# Patient Record
Sex: Male | Born: 1945 | Race: White | Hispanic: No | Marital: Married
Health system: Southern US, Community
[De-identification: ages and names within clinical notes are randomized; demographics above are authoritative.]

---

## 2004-01-20 ENCOUNTER — Inpatient Hospital Stay: Payer: Self-pay | Admitting: Internal Medicine

## 2004-01-20 ENCOUNTER — Ambulatory Visit: Payer: Self-pay | Admitting: Family Medicine

## 2004-01-20 ENCOUNTER — Other Ambulatory Visit: Payer: Self-pay

## 2004-01-27 ENCOUNTER — Ambulatory Visit: Payer: Self-pay | Admitting: Internal Medicine

## 2004-01-27 ENCOUNTER — Inpatient Hospital Stay: Payer: Self-pay

## 2005-01-05 ENCOUNTER — Emergency Department: Payer: Self-pay | Admitting: Emergency Medicine

## 2005-01-06 ENCOUNTER — Inpatient Hospital Stay: Payer: Self-pay

## 2005-05-26 ENCOUNTER — Ambulatory Visit: Payer: Self-pay | Admitting: Gastroenterology

## 2005-06-02 ENCOUNTER — Other Ambulatory Visit: Payer: Self-pay

## 2005-06-10 ENCOUNTER — Ambulatory Visit: Payer: Self-pay | Admitting: General Surgery

## 2005-08-07 ENCOUNTER — Emergency Department: Payer: Self-pay | Admitting: Emergency Medicine

## 2006-01-06 ENCOUNTER — Ambulatory Visit: Payer: Self-pay | Admitting: Gastroenterology

## 2006-01-17 ENCOUNTER — Ambulatory Visit: Payer: Self-pay | Admitting: Gastroenterology

## 2008-03-06 IMAGING — US ABDOMEN ULTRASOUND
1 series · 16 of 25 positions shown · non-contrast
Comparison: none

REASON FOR EXAM: Six month follow-up of Hepatitis B
COMMENTS:

[Series 1: abdomen ultrasound · 16 of 72 slices shown]
[im 1/72]
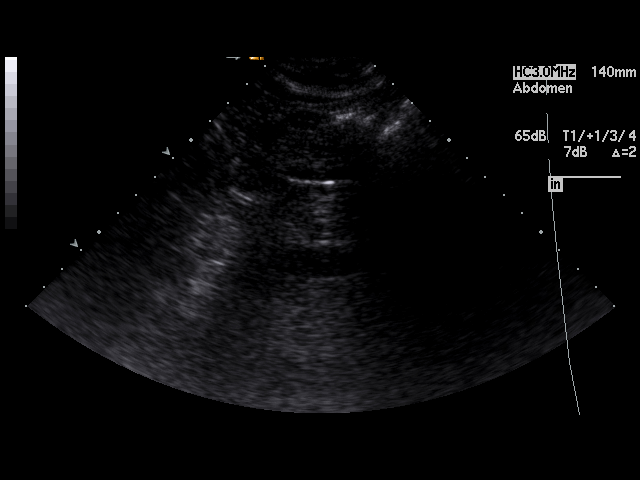
[im 6/72]
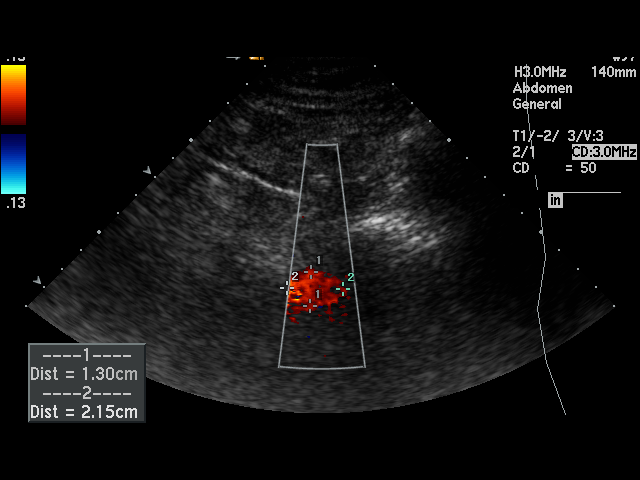
[im 9/72]
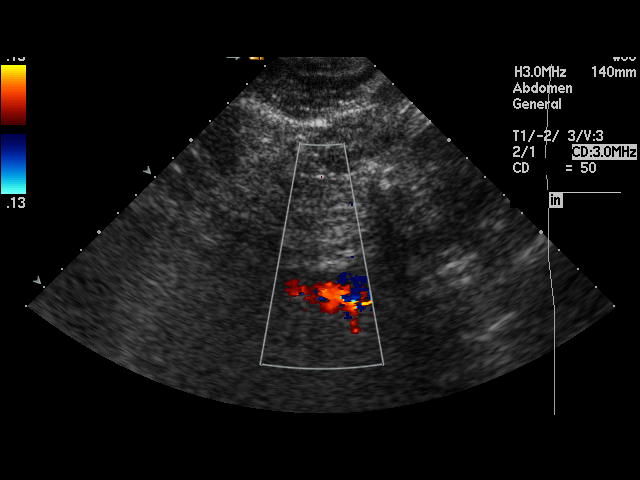
[im 15/72]
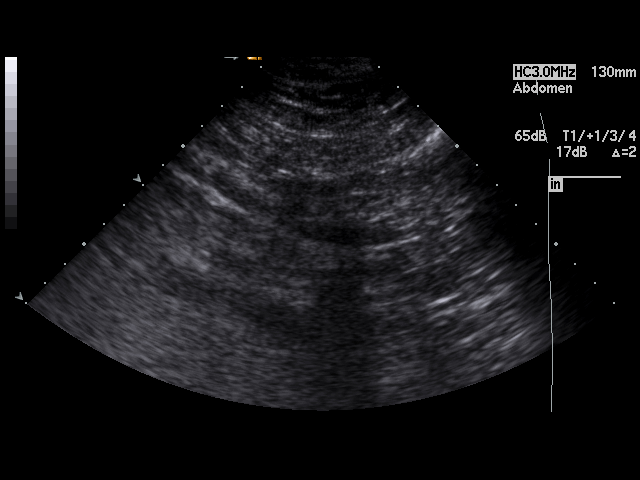
[im 21/72]
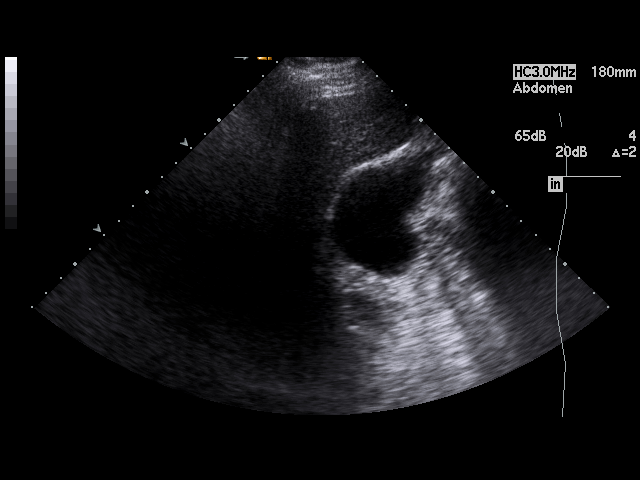
[im 24/72]
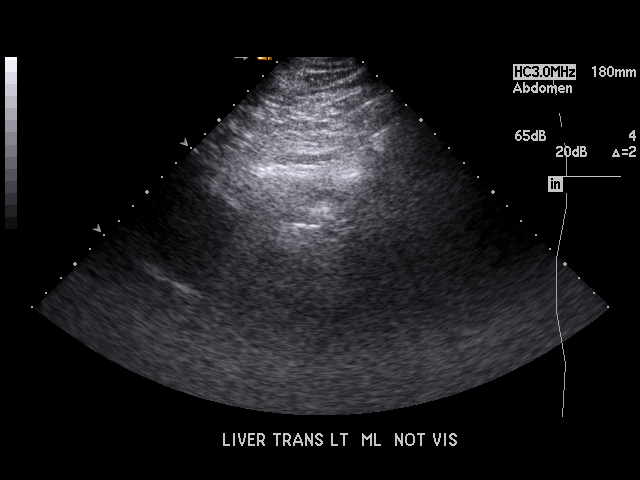
[im 30/72]
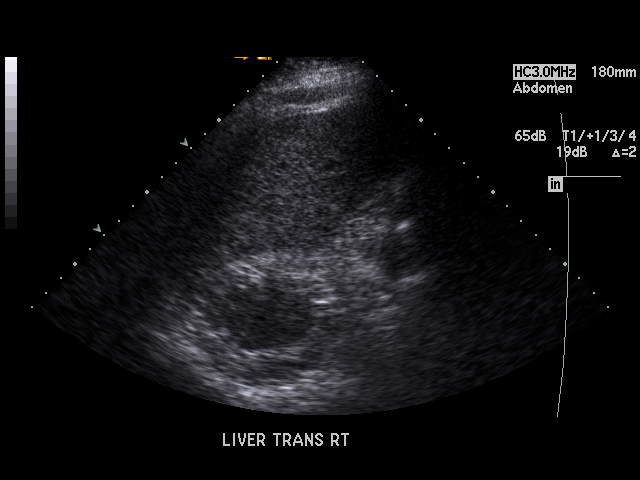
[im 33/72]
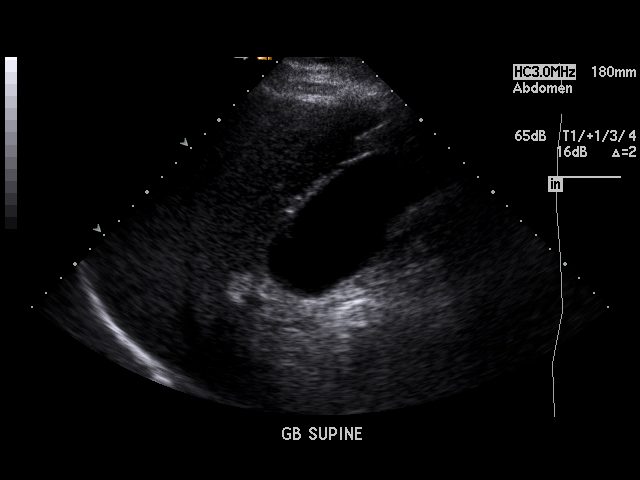
[im 39/72]
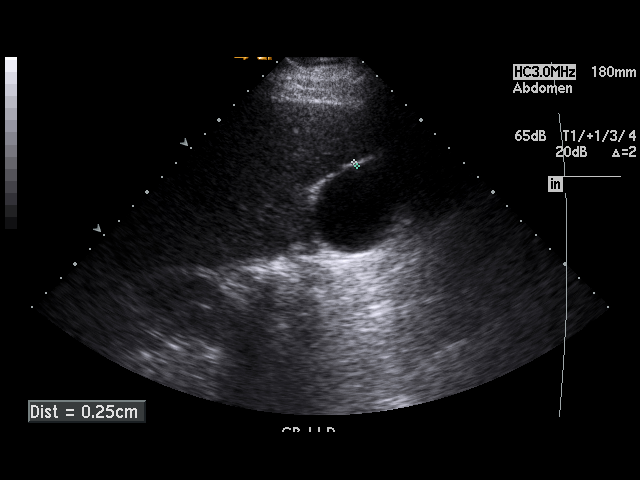
[im 42/72]
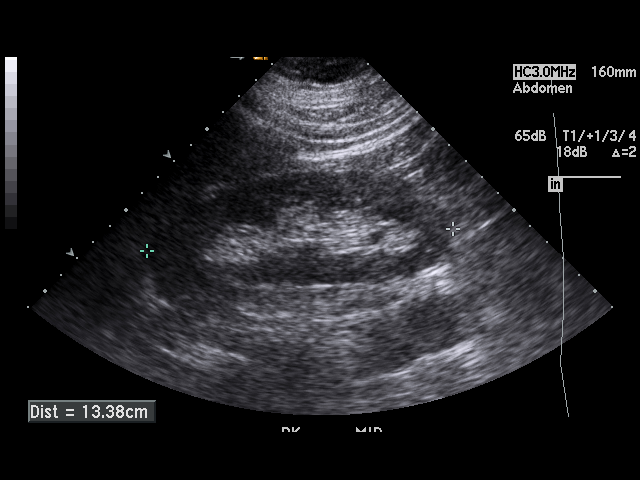
[im 48/72]
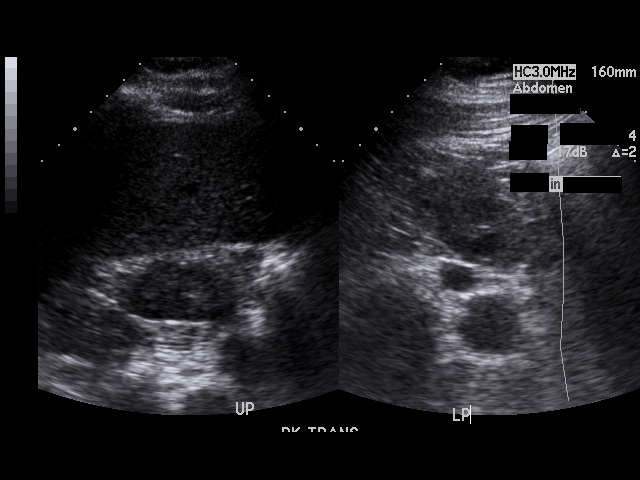
[im 51/72]
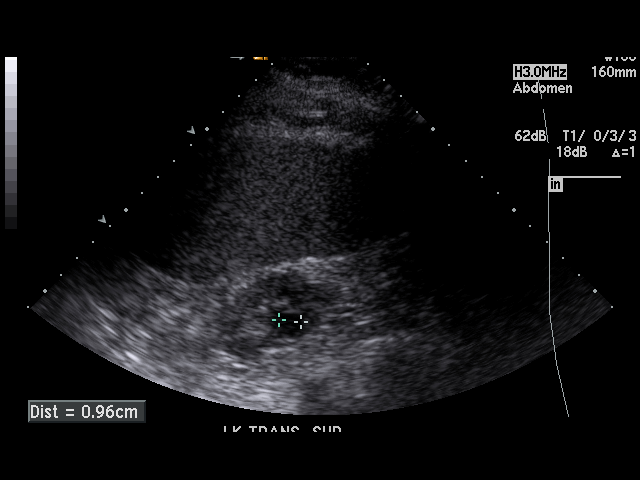
[im 57/72]
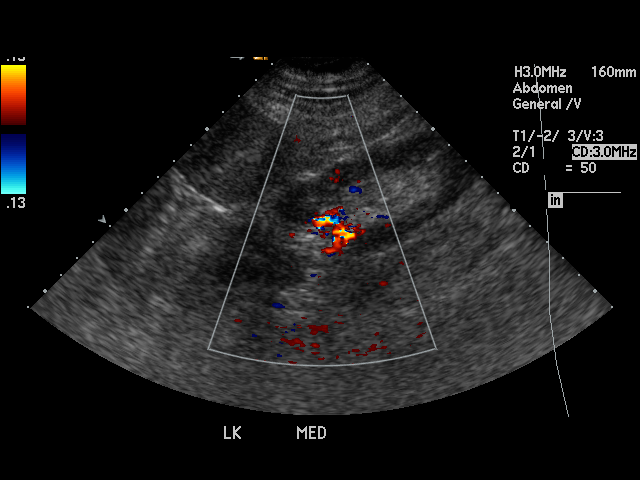
[im 63/72]
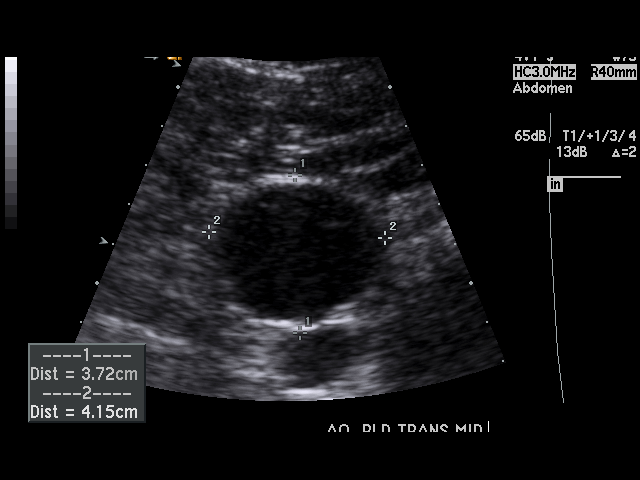
[im 66/72]
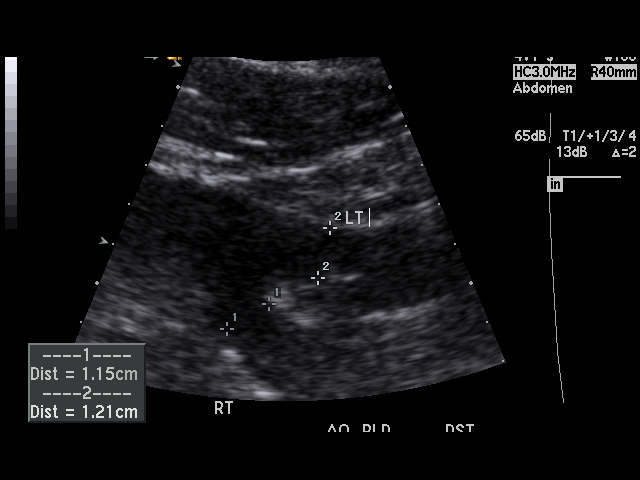
[im 72/72]
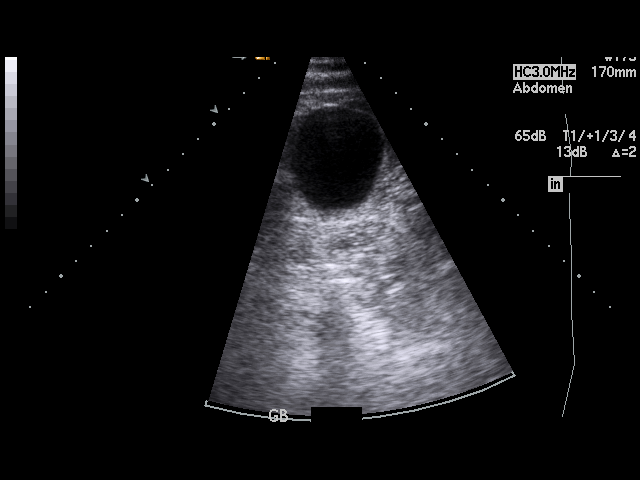

[16 of 25 positions shown; findings below may reference images not displayed]

PROCEDURE:     US  - US ABDOMEN GENERAL SURVEY  - January 06, 2006  [DATE]

RESULT:     The patient has a history of Hepatitis B.

Reference is made to a report of a prior study of 05/26/2005.

The observed portions of the liver are normal in appearance. The LEFT lobe
could not be assessed adequately. The pancreas could not be demonstrated
either due to bowel gas as well. The gallbladder is adequately distended
with no evidence of stones, wall thickening or pericholecystic fluid. The
common bile duct is normal at 3.4 mm in diameter. The spleen remains
enlarged at approximately 17.1 cm. The abdominal aorta exhibits a mid and
distal aneurysm with maximal dimension of 4.2 cm which occurs in the
transverse plane. This does appear to have increased in size since the prior
study. Further evaluation with CT scanning may be of value to better assess
the anatomy of the aneurysm.

The kidneys are normal in size. The RIGHT kidney measures 13.4 cm and the
LEFT kidney measures 12.4 cm. The LEFT kidney demonstrates the presence of
cystic structures. One lies in the upper pole measuring approximately 1.4 cm
in greatest dimension while the second is in the lower pole and also
measures approximately 1.4 cm in greatest dimension. There is no
hydronephrosis.
IMPRESSION: 1.     There is stable splenomegaly at approximately 17.1 cm. I see no acute
abnormality of the visualized portions of the liver. There is no evidence of
abnormality of the gallbladder.
2.     There is an abdominal aortic aneurysm which has increased in size to
approximately 4.2 cm in maximal dimension. This is from 3.9 cm on the prior
study. Correlation with CT scanning may be of value.
3.     There are findings compatible with simple cysts in the LEFT kidney.

## 2013-10-07 LAB — URINALYSIS, COMPLETE
BACTERIA: NEGATIVE
SPECIFIC GRAVITY: 1.026 (ref 1.003–1.030)
SQUAMOUS EPITHELIAL: NONE SEEN
WBC UR: NONE SEEN /HPF (ref 0–5)

## 2013-10-07 LAB — COMPREHENSIVE METABOLIC PANEL
ALT: 22 U/L
Albumin: 2.7 g/dL — ABNORMAL LOW (ref 3.4–5.0)
Alkaline Phosphatase: 79 U/L
Anion Gap: 10 (ref 7–16)
BUN: 17 mg/dL (ref 7–18)
Bilirubin,Total: 0.8 mg/dL (ref 0.2–1.0)
CO2: 21 mmol/L (ref 21–32)
Calcium, Total: 8 mg/dL — ABNORMAL LOW (ref 8.5–10.1)
Chloride: 113 mmol/L — ABNORMAL HIGH (ref 98–107)
Creatinine: 1.53 mg/dL — ABNORMAL HIGH (ref 0.60–1.30)
EGFR (African American): 53 — ABNORMAL LOW
GFR CALC NON AF AMER: 46 — AB
GLUCOSE: 107 mg/dL — AB (ref 65–99)
OSMOLALITY: 289 (ref 275–301)
Potassium: 3.6 mmol/L (ref 3.5–5.1)
SGOT(AST): 28 U/L (ref 15–37)
Sodium: 144 mmol/L (ref 136–145)
Total Protein: 6.7 g/dL (ref 6.4–8.2)

## 2013-10-07 LAB — CBC
HCT: 31.5 % — AB (ref 40.0–52.0)
HGB: 10 g/dL — ABNORMAL LOW (ref 13.0–18.0)
MCH: 28 pg (ref 26.0–34.0)
MCHC: 31.7 g/dL — ABNORMAL LOW (ref 32.0–36.0)
MCV: 88 fL (ref 80–100)
Platelet: 49 10*3/uL — ABNORMAL LOW (ref 150–440)
RBC: 3.57 10*6/uL — ABNORMAL LOW (ref 4.40–5.90)
RDW: 19.2 % — ABNORMAL HIGH (ref 11.5–14.5)
WBC: 3.6 10*3/uL — ABNORMAL LOW (ref 3.8–10.6)

## 2013-10-07 LAB — LIPASE, BLOOD: LIPASE: 160 U/L (ref 73–393)

## 2013-10-08 ENCOUNTER — Observation Stay: Payer: Self-pay | Admitting: Internal Medicine

## 2013-10-09 LAB — CBC WITH DIFFERENTIAL/PLATELET
BASOS ABS: 0 10*3/uL (ref 0.0–0.1)
Basophil %: 0.9 %
EOS ABS: 0.1 10*3/uL (ref 0.0–0.7)
Eosinophil %: 1.9 %
HCT: 27 % — AB (ref 40.0–52.0)
HGB: 8.6 g/dL — ABNORMAL LOW (ref 13.0–18.0)
Lymphocyte #: 0.4 10*3/uL — ABNORMAL LOW (ref 1.0–3.6)
Lymphocyte %: 12.6 %
MCH: 28.2 pg (ref 26.0–34.0)
MCHC: 31.7 g/dL — AB (ref 32.0–36.0)
MCV: 89 fL (ref 80–100)
Monocyte #: 0.3 x10 3/mm (ref 0.2–1.0)
Monocyte %: 11.2 %
Neutrophil #: 2.2 10*3/uL (ref 1.4–6.5)
Neutrophil %: 73.4 %
Platelet: 44 10*3/uL — ABNORMAL LOW (ref 150–440)
RBC: 3.04 10*6/uL — AB (ref 4.40–5.90)
RDW: 19.1 % — AB (ref 11.5–14.5)
WBC: 2.9 10*3/uL — AB (ref 3.8–10.6)

## 2013-10-09 LAB — BASIC METABOLIC PANEL
Anion Gap: 7 (ref 7–16)
BUN: 14 mg/dL (ref 7–18)
CHLORIDE: 113 mmol/L — AB (ref 98–107)
CREATININE: 1.2 mg/dL (ref 0.60–1.30)
Calcium, Total: 7.7 mg/dL — ABNORMAL LOW (ref 8.5–10.1)
Co2: 24 mmol/L (ref 21–32)
EGFR (Non-African Amer.): >60
GLUCOSE: 82 mg/dL (ref 65–99)
Osmolality: 286 (ref 275–301)
POTASSIUM: 3.7 mmol/L (ref 3.5–5.1)
SODIUM: 144 mmol/L (ref 136–145)

## 2013-10-10 LAB — BASIC METABOLIC PANEL
Anion Gap: 6 — ABNORMAL LOW (ref 7–16)
BUN: 13 mg/dL (ref 7–18)
Calcium, Total: 8.1 mg/dL — ABNORMAL LOW (ref 8.5–10.1)
Chloride: 112 mmol/L — ABNORMAL HIGH (ref 98–107)
Co2: 27 mmol/L (ref 21–32)
Creatinine: 1.28 mg/dL (ref 0.60–1.30)
EGFR (African American): >60
GFR CALC NON AF AMER: 57 — AB
Glucose: 86 mg/dL (ref 65–99)
Osmolality: 288 (ref 275–301)
Potassium: 4.3 mmol/L (ref 3.5–5.1)
SODIUM: 145 mmol/L (ref 136–145)

## 2013-10-10 LAB — CBC WITH DIFFERENTIAL/PLATELET
Basophil #: 0 10*3/uL (ref 0.0–0.1)
Basophil %: 1 %
Eosinophil #: 0.1 10*3/uL (ref 0.0–0.7)
Eosinophil %: 3.6 %
HCT: 26.9 % — AB (ref 40.0–52.0)
HGB: 8.5 g/dL — AB (ref 13.0–18.0)
Lymphocyte #: 0.4 10*3/uL — ABNORMAL LOW (ref 1.0–3.6)
Lymphocyte %: 16.7 %
MCH: 27.8 pg (ref 26.0–34.0)
MCHC: 31.6 g/dL — AB (ref 32.0–36.0)
MCV: 88 fL (ref 80–100)
MONO ABS: 0.3 x10 3/mm (ref 0.2–1.0)
Monocyte %: 12.4 %
Neutrophil #: 1.5 10*3/uL (ref 1.4–6.5)
Neutrophil %: 66.3 %
Platelet: 46 10*3/uL — ABNORMAL LOW (ref 150–440)
RBC: 3.05 10*6/uL — ABNORMAL LOW (ref 4.40–5.90)
RDW: 19.2 % — ABNORMAL HIGH (ref 11.5–14.5)
WBC: 2.3 10*3/uL — AB (ref 3.8–10.6)

## 2014-04-04 ENCOUNTER — Emergency Department: Payer: Self-pay | Admitting: Emergency Medicine

## 2014-04-15 ENCOUNTER — Ambulatory Visit
Admit: 2014-04-15 | Disposition: A | Discharge status: Home / Self Care | Payer: Self-pay | Attending: Internal Medicine | Admitting: Internal Medicine

## 2014-04-19 ENCOUNTER — Ambulatory Visit: Payer: Self-pay | Admitting: Urology

## 2014-04-19 ENCOUNTER — Inpatient Hospital Stay: Payer: Self-pay | Admitting: Internal Medicine

## 2014-05-16 ENCOUNTER — Ambulatory Visit
Admit: 2014-05-16 | Disposition: A | Discharge status: Home / Self Care | Payer: Self-pay | Attending: Internal Medicine | Admitting: Internal Medicine

## 2014-05-16 DEATH — deceased

## 2014-06-07 NOTE — Consult Note (Signed)
Brief Consult Note: Diagnosis: Hematuria. Bladder cancer. 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   Patient was seen by consultant.   Consult note dictated.   Recommend further assessment or treatment.   Orders entered.   Discussed with Attending MD.   Comments: Urine is clear now so CBI can be discontiued. rmove foley in AM. [facility].  Electronic Signatures: Orson ApeWolff, Genie Mirabal R (MD)  (Signed 25-Aug-15 12:14)  Authored: Brief Consult Note   Last Updated: 25-Aug-15 12:14 by Orson ApeWolff, [place] Oborn R (MD)

## 2014-06-07 NOTE — Discharge Summary (Signed)
PATIENT NAME:  Matthew Fitzgerald, Matthew Fitzgerald MR#:  [id number] DATE OF BIRTH:  Feb 01, 1946  DATE OF ADMISSION:  10/08/2013 DATE OF DISCHARGE:  10/10/2013  DISCHARGE DIAGNOSES:  1.  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 will require outpatient neurologic followup. 2.  Acute on chronic anemia, likely blood loss from hematuria, now hemodynamically stable.   SECONDARY DIAGNOSES:  1.  Hypertension.  2.  Hyperlipidemia.  3.  History of bladder cancer.  4.  Aortic aneurysm, status post placement of 6 stents.  5.  Esophageal rupture. 6.  Hepatitis B.  7.  Hernia repair.   CONSULTATION: Urology, Anola GurneyMichael Wolff, MD   PROCEDURES AND RADIOLOGY: None.  LABORATORY DATA: UA on admission was negative.   HISTORY AND SHORT HOSPITAL COURSE: The patient is a 69 year old male with the above mentioned medical problems, was admitted for hematuria. Please see Dr. Heron NayVasireddy dictated history and physical for further details. Foley catheterization was placed. Urology consultation was obtained with Dr. Anola GurneyMichael Wolff who recommended continuing continuous bladder irrigation until hematuria clears up. Within 24 hours this was cleared. His hematuria was thought to be possibly from radiation cystitis from underlying bladder cancer. As the hematuria resolved, the patient's Foley catheter was pulled out and he was able to urinate and is being discharged home in stable condition, as he remained hemodynamically stable on 27th of August. On the date of discharge his vital signs were as follows: Temperature 98.3, heart rate 68 per minute, respirations 18 per minute, blood pressure 107/66, oxygen saturation 93% on room air.   PERTINENT PHYSICAL EXAMINATION ON THE DATE OF DISCHARGE:  CARDIOVASCULAR: S1, S2 normal. No murmurs, rubs or gallop.  LUNGS: Clear to auscultation bilaterally. No wheezing, rales, rhonchi or crepitation.  ABDOMEN: Soft, obese and benign.  NEUROLOGIC: Nonfocal examination.  All other  physical examination remained at baseline.   DISCHARGE MEDICATIONS:  1.  Senna 1 tablet p.o. at bedtime as needed.  2.  Amlodipine 5 mg p.o. daily.  3.  Atorvastatin 80 mg p.o. at bedtime.  4.  Entecavir 0.5 mg p.o. daily. 5.  Finasteride 5 mg p.o. daily.  6.  Propranolol 40 mg p.o. b.i.d.  7.  Tamsulosin 0.4 mg p.o. daily.  8.  Aspirin 81 mg p.o. b.i.d., to be held until 30th of August, can be resumed on 10/14/2013, on Monday.   DISCHARGE DIET: Low sodium.   DISCHARGE ACTIVITY: As tolerated.   DISCHARGE INSTRUCTIONS AND FOLLOWUP: The patient was instructed to follow up with his primary care physician, Dr. Launa FlightAnn Chelminski in 1-2 weeks. [facility] and certainly call their office if he starts having more trouble with urination or hematuria for earlier appointment.   TOTAL TIME DISCHARGING THIS PATIENT: Forty minutes.   ____________________________ Ellamae SiaVipul S. Sherryll BurgerShah, MD vss:TT D: 10/10/2013 13:43:56 ET T: 10/10/2013 15:59:28 ET JOB#: [id number]  cc: Chaos Carlile S. Sherryll BurgerShah, MD, <Dictator> Riley LamAnn N. Chelminski, MD Suszanne ConnersMichael R. Evelene CroonWolff, MD Ellamae SiaVIPUL S [facility] MD ELECTRONICALLY SIGNED 10/11/2013 12:06

## 2014-06-07 NOTE — H&P (Signed)
PATIENT NAME:  Matthew Fitzgerald, Matthew Fitzgerald MR#:  [id number] DATE OF BIRTH:  06-20-1945  DATE OF ADMISSION:  10/08/2013  PRIMARY CARE PHYSICIAN: Launa Flight, MD.  REFERRING PHYSICIAN:  Toney Rakes, MD.  CHIEF COMPLAINT:  Hematuria.   HISTORY OF PRESENT ILLNESS: Matthew Fitzgerald is a 69 year old pleasant male with history of aortic aneurysm status post a stent placement, hypertension, hyperlipidemia, who was diagnosed with a bladder cancer in May 2015. The patient underwent radiation therapy at [facility]. The patient completed the course of the treatment about 1 month back.  Since then the patient has been experiencing hematuria. This is his 4th admission. He was admitted to [facility]. The patient was discharged in this afternoon. He continued to have hematuria. The patient came back home, started to experience severe pain in the suprapubic area, was unable to urinate. Concerning this he came to the Emergency Department.  After a Foley catheter was placed the patient had improvement of the pain in his abdomen. The patient was initiated on a 3-way catheter for the bladder. He denies having any fever. The patient does not want to go back to the [facility], requesting to be seen by a urologist here.   PAST MEDICAL HISTORY:  1.  Hypertension.  2.  Hyperlipidemia.  3.  Continued tobacco use.  4.  Bladder cancer.  5.  Aortic aneurysm, status post placement of 6 stents.  6.  Esophageal rupture. 7.  Hepatitis B.  8.  Hernia repair.   ALLERGIES: No known drug allergies.   HOME MEDICATIONS:  1.  Tamsulosin 0.4 mg once a day.  2.  Senna 8.6 mg once a day.  3.  Propranolol 40 mg 2 times a day.  4.  Finasteride 5 mg once a day.  5.  Entecavir 0.5 mg once a day.  6.  Atorvastatin 80 mg once a day.  7.  Aspirin 81 mg 2 capsules once a day.   SOCIAL HISTORY: Continues to smoke 1.5 packs per day. Denies drinking alcohol or using illicit drugs.   FAMILY HISTORY: Mother lived to be 32. Father died of  myocardial infarction.   REVIEW OF SYSTEMS:  CONSTITUTIONAL: Experiencing generalized weakness.  EYES: No change in vision.  ENT: No change in hearing.  RESPIRATORY: No cough, shortness of breath.  CARDIOVASCULAR: No chest pain, palpations.  GASTROINTESTINAL: No nausea or vomiting. Has abdominal pain.  GENITOURINARY: Has hematuria.  SKIN: No rash or lesions.  MUSCULOSKELETAL: No joint pains and aches.  NEUROLOGIC: No weakness or numbness in any part of the body.   PHYSICAL EXAMINATION:  GENERAL: A thin-built male lying down in the bed, not in distress.  VITAL SIGNS: Temperature 98.2, pulse 109, blood pressure 144/109, respiratory rate of 30, oxygen saturation is 95% on room air.  HEENT: Head normocephalic, atraumatic, there is no scleral icterus. Conjunctivae normal. Pupils equal and reactive. Mucous membranes moist. No pharyngeal erythema.  NECK: Supple. No lymphadenopathy. No JVD. No carotid bruit.  CHEST: Has no focal tenderness.  LUNGS: Clear to auscultation bilaterally.  HEART: S1 and S2 regular. No murmurs are heard.  ABDOMEN: Bowel sounds present. Soft. Has tenderness in the suprapubic area, mild. No rebound or guarding. Could not appreciate any hepatosplenomegaly. Foley catheter in place.  EXTREMITIES: No pedal edema. Pulses 2+.  SKIN: No rash or lesions.  MUSCULOSKELETAL: Good range of motion in all the extremities.  NEUROLOGIC: The patient is alert, oriented to place, person, and time. Cranial nerves II-XII intact. Motor 5/5 in  upper and lower extremities.   LABORATORY DATA: BMP: BUN 17, creatinine 1.53, rest of the values are within normal limits.  CBC: WBC of 3.6, hemoglobin 10, platelet count of 49,000.   Urinalysis shows too-numerous-to-count RBCs.   ASSESSMENT AND PLAN: Mr. Matthew Fitzgerald is a 69 year old with known history of bladder cancer who comes to the Emergency Department with hematuria.  1.  Hematuria. Will continue with 3-way catheter education. Continue to monitor  CBC. If required will transfuse for hemoglobin less than 8. 2.  Pancytopenia. This could be secondary to possible siderosis as the patient has a history of hepatitis B with portal hypertension.  3.  Continued tobacco use. Counseled with the patient regarding quitting smoking. Seems to have poor insight.  4.  Bladder cancer.  [facility]. Will consult urology in the morning.  5.  Hypertension, currently well-controlled. Will hold all blood pressure medications.  6.  Keep the patient on DVT prophylaxis with SCDs.    ____________________________ Susa GriffinsPadmaja Elena Cothern, MD pv:lt D: 10/08/2013 01:20:04 ET T: 10/08/2013 06:58:23 ET JOB#: [id number]  cc: Susa GriffinsPadmaja Conway Fedora, MD, <Dictator> Susa GriffinsPADMAJA Minnah Llamas MD ELECTRONICALLY SIGNED 10/09/2013 0:21

## 2014-06-07 NOTE — Consult Note (Signed)
PATIENT NAME:  Matthew Fitzgerald, Matthew E MR#:  [id number] DATE OF BIRTH:  1945/09/11  DATE OF CONSULTATION:  10/08/2013  REFERRING PHYSICIAN:  Susa GriffinsPadmaja Vasireddy, MD CONSULTING PHYSICIAN:  Suszanne ConnersMichael R. Evelene CroonWolff, MD  REASON FOR CONSULTATION: Hematuria.   HISTORY OF PRESENT ILLNESS: Matthew Fitzgerald is a 69 year old Caucasian male who presented to the Emergency Room with gross hematuria and passage of clots. He developed suprapubic pain and bladder distention and required catheter placement in the Emergency Room. He has been on continuous bladder irrigation since last night. His urine is clear at this time. He is not experiencing any pain at this time. He has a history of bladder cancer. He was treated with a course of radiation that was completed 4 weeks ago. The patient states that he was not a candidate for radical cystectomy or chemotherapy due to underlying medical problems. He has also had [facility] as well.   ALLERGIES: No known drug allergies.  HOME MEDICATIONS: Include tamsulosin, senna, propranolol, finasteride, entecavir, atorvastatin and aspirin.   PAST SURGICAL HISTORY: Included:  1.  Transurethral bladder tumor resection approximately 2 months ago.  2.  Some type of kidney stone surgery 4 years ago.  3.  Aortic aneurysm stent placement.  PAST AND CURRENT MEDICAL CONDITIONS:  1.  Hepatitis B.  2.  Hyperlipidemia.  3.  Hypertension.  4.  Esophageal rupture.  REVIEW OF SYSTEMS: The patient denied flank pain. He does have a history of BPH with LUTS and is currently being managed with finasteride and tamsulosin. He does not recall recent PSA results.   FAMILY HISTORY: Remarkable for father with heart disease.   SOCIAL HISTORY: The patient smokes a pack half a day and has a greater than 50 pack-year history. He denied alcohol use.   PHYSICAL EXAMINATION: GENERAL: Well-nourished white male in no acute distress.  HEENT: Sclerae were clear.  NECK: Supple.   LUNGS: Clear to auscultation.  ABDOMEN: Soft. Bladder was not palpably distended. Foley catheter was in good position.  RECTAL: Deferred.   PERTINENT LABORATORY STUDIES: Included a BUN of 17 and creatinine 1.53. Hematocrit was 31.5% and white cell count was 3,600.   IMPRESSION: 1.  Gross hematuria with retention.  2.  Bladder cancer.  3.  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   SUGGESTIONS: 1.  Discontinue continuous bladder irrigation and remove the catheter in the morning, if the urine remains clear. 2.  Increase overall fluid intake.  3.  Discontinue aspirin. 4.  [facility] for further evaluation and treatment.  ____________________________ Suszanne ConnersMichael R. Evelene CroonWolff, MD mrw:sb D: 10/08/2013 12:25:45 ET T: 10/08/2013 13:14:43 ET JOB#: [id number]  cc: Suszanne ConnersMichael R. Evelene CroonWolff, MD, <Dictator> Orson ApeMICHAEL R WOLFF MD ELECTRONICALLY SIGNED 10/09/2013 9:39

## 2014-06-15 NOTE — Consult Note (Signed)
ONCOLOGY followup - still weak. Pain better controlled.no fevers. Eating better.weak-looking, resting in bed, alert and oriented, no acute distress.            vitals - afebrile, stable.          lungs - bilateral good breath sounds          abd - soft, NT          ext - pedal edema  Cr 1.55, calcium 7.8.  04/21/14 - CT chest. IMPRESSION: 1. Numerous bilateral pulmonary nodules are identified compatible with metastatic disease. 2. Subtle lucent abnormality within the posterior T4 vertebral body which may represent an early lytic bone metastasis. 3. Atherosclerotic disease including Coronary artery calcifications.  69 year old gentleman with h/o bladder cancer ([facility] and [facility]) now diagnosed with widespread metastatic disease. CT scan of the abdomen and pelvis is showing findings consistent with widespread metastatic disease to the lungs, liver, bones, and lymphadenopathy. CT chest shows multiple lung lesions s/o metastases. Have explained to the patient and his son about radiological findings are most consistent with metastatic stage IV bladder cancer and also explained that with metastatic disease, stage IV malignancy is incurable and treatments like chemotherapy offered are with palliative intent only. Also have explained that he is a poor candidate to tolerate chemotherapy given poor performance status (ECOG 3-4), pre-existing pancytopenia and co-morbidities including cirrhosis. Patient and son have made decision no to pursue any cancer treatment and he is pursuing home hospice. Pain is under better control. Will see him on as-needed basis.    Electronic Signatures: Izola PricePandit, Aavya Shafer Raj (MD)  (Signed on 08-Mar-16 21:00)  Authored  Last Updated: 08-Mar-16 21:00 by Izola PricePandit, Katriana Dortch Raj (MD)

## 2014-06-15 NOTE — Consult Note (Signed)
PATIENT NAME:  Matthew Fitzgerald, Matthew Fitzgerald MR#:  [id number] DATE OF BIRTH:  29-Mar-1945  DATE OF CONSULTATION:  04/19/2014  REQUESTING PHYSICIAN:  Matthew Kind, MD CONSULTING PHYSICIAN:  Tawni Millers. Dezra Mandella, II, MD   REASON FOR CONSULTATION: Hematuria in the setting of known bladder cancer.  HISTORY OF PRESENT ILLNESS: [name] is a 69 year old male with extensive bladder cancer history.  He is status post radiation treatment and surgery at [facility].  He is under the care of [facility] urology and oncology.  He has known hypertension, hepatitis B and abdominal aortic aneurysm.  He has bilateral percutaneous nephrostomy tube.  He does void without a catheter and has chronic hematuria per the patient.  He comes to the ER and was admitted for increasing back pain.  On presentation, he denied any fever, chills or sweats or weakness, dizziness. He did have gross blood clots per urethra. He says he often passes these and has no issues passing these.  On admission, his creatinine was 7.8 and 2.5. A CT scan was ordered to check placement of percutaneous nephrostomy tubes which showed metastatic bladder cancer, metastatic to the lung, liver and bones, also noted with a pathologic fracture at L5, significant back pain, L5 vertebrae.  He has had an unremarkable course since admission. The patient denies any suprapubic pain or difficulty passing his clots.   PAST MEDICAL HISTORY:  1.  Metastatic bladder cancer status post radiation and surgery [facility].  2.  Hepatitis B.  3.  AAA.  PAST SURGICAL HISTORY:   1.  Bladder resection followed by bladder radiation.  2.  Nephrostomy tube placement.  3.  AAA stenting.   ALLERGIES: No known drug allergies.   FAMILY HISTORY: Noncontributory.   SOCIAL HISTORY: Divorced, lives with his son.   OUTPATIENT MEDICATIONS: Up-to-date and reviewed in detail in the chart. The patient is on finasteride and tamsulosin per urology.   REVIEW OF SYSTEMS:  The balance of 12 point review of systems  negative other than outlined above.   PHYSICAL EXAMINATION:  VITAL SIGNS: Temperature 36.8, pulse 69, respiratory rate 18, blood pressure 102/61, sating 93% on room air.  GENERAL: Alert and oriented x 3, conversational, interactive, quite pleasant Caucasian male.  HEENT: Normocephalic, atraumatic.  NECK: Supple, without any lymphadenopathy.  LUNGS:  Clear to auscultation.  Good respiratory effort.  CARDIOVASCULAR: Regular rate and rhythm.  GASTROINTESTINAL: Soft, nontender. He does not have any suprapubic tenderness.  BACK:  Bilateral nephrostomy tube draining clear urine with some sediment and some old blood, but draining clear in to the bag, mild tenderness over the lumbar spine.  GENITOURINARY: Normal penis. The patient states he is able to easily void clots without pain.  MUSCULOSKELETAL: 5/5 strength.  SKIN: Normal.  EXTREMITIES: 2+ upper and lower extremity pulses. No edema.  NEUROLOGIC: Grossly intact.  PSYCHIATRIC: Appropriate.   LABORATORY VALUES: Creatinine 1.25, hemoglobin and hematocrit 7.8 and 25.3. CT scan metastatic bladder cancer and a pathologic L5 fracture.  Bladder wall has likely radiation treatment changes and his bladder percutaneous nephrostomy tubes are in the appropriate place.   ASSESSMENT: This is a 69 year old Caucasian male with metastatic bladder cancer and L5 pathologic fracture, who is status post resection and radiation at [facility].  He was not deemed a candidate for radical cystectomy and chemotherapy due to his ongoing medical problems per chart review.  He is currently with normal creatinine with bilateral draining percutaneous nephrostomy tubes in the correct position.  He does void clots from his urethra, but this  has been stable per patient and he does this without difficulty or pain.   PLAN:  1.  Continue management of bilateral percutaneous nephrostomy tubes. I do not recommend catheter placement at this time due to patient comfort.  If the patient is unable  to pass a clot, or has suprapubic tenderness consistent with urinary retention, it would be reasonable place a large-caliber catheter to irrigate him free from this clot and then subsequently remove the catheter. The patient said he has had this done many times and it is not painful for him.  Indwelling catheter is a limited utility given functioning bilateral percutaneous nephrostomy tubes.  2.  Recommend overall health status in terms of his anemia, infection, treat it as per hospitalist recommendations.  There will be no additional urological management that can be performed on him at this time given the extensiveness of his disease.  If the patient desires further treatment, I would recommend [facility] or oncology for further discussions regarding management of his advanced bladder cancer.   3.  It would be reasonable to consider palliative care consultation given overall health status and L5 pathologic fracture in the setting of advanced metastatic bladder cancer.      ____________________________ Tawni MillersEdward R. Weldon InchesHouser, II, MD erh:DT D: 04/19/2014 11:23:36 ET T: 04/19/2014 13:23:13 ET JOB#: [id number]  cc: Tawni MillersEdward R. Weldon InchesHouser, II, MD, <Dictator> Beaulah CorinEDWARD R Saidee Geremia MD ELECTRONICALLY SIGNED 04/19/2014 17:17

## 2014-06-15 NOTE — Consult Note (Signed)
   Comments   Family and pt asked for another meeting with me.  Pt willing to accept hospice services and to try "the steroid".  will order both. Hospice diagnosis: metastatic bladder cancerhepatitis B 50%  Electronic Signatures: Reather LaurenceMantzouris, Rayel Santizo J (NP)  (Signed 07-Mar-16 09:57)  Authored: Palliative Care Phifer, Harriett SineNancy (MD)  (Signed 07-Mar-16 16:11)  Authored: Palliative Care   Last Updated: 07-Mar-16 16:11 by Phifer, Harriett SineNancy (MD)

## 2014-06-15 NOTE — Consult Note (Signed)
PATIENT NAME:  Matthew Fitzgerald, Matthew Fitzgerald MR#:  [id number] DATE OF BIRTH:  21-Mar-1945  DATE OF CONSULTATION:  04/19/2014  REFERRING PHYSICIAN:  Dr. Betti Cruz  CONSULTING PHYSICIAN:  Mahamud Metts R. Sherrlyn Hock, MD  REASON FOR CONSULTATION: Metastatic bladder cancer.   HISTORY OF PRESENT ILLNESS: The patient is a 69 year old gentleman with past medical history significant for hepatitis B, AAA status post stenting, reportedly has bladder cancer status post surgical resection at [facility] followed by radiation therapy there along with bilateral nephrostomy tube placement. The patient states that his bladder cancer was diagnosed around mid 2015 and he initially underwent transurethral resection following which they gave him a few weeks of radiation treatment. He states that he has had recurrent hematuria in January and was hospitalized for a long time at [facility] but does not remember if he had any radiological restaging of malignancy. He is currently admitted with progressive or hematuria per urethra and also from nephrostomy tubes for a few months now along with increasing back pain. He also has developed progressive weakness. Appetite is good otherwise.  CT scan of the abdomen and pelvis done upon admission revealed multiple lesions suggestive of metastases to the liver, lung, bone and lymphadenopathy, along with pathological fracture of L5 vertebra. He states that currently Dilaudid IV is helping his pain, but is not lasting long enough. He has mild cough, dry. No hemoptysis or chest pain. Denies any dyspnea, orthopnea, or PND.   PAST MEDICAL HISTORY AND PAST SURGICAL HISTORY: As in HPI above.   FAMILY HISTORY: Remarkable for heart disease. Denies malignancy.   SOCIAL HISTORY: Denies history of smoking, alcohol or recreational drug usage. Physically active and ambulatory.   ALLERGIES: No known drug allergies.   HOME MEDICATIONS: Finasteride 5 mg p.o. daily, propranolol 40 mg b.i.d., Senna 8.6 mg daily p.r.n. for constipation,  tamsulosin 0.4 mg capsule 1 capsule q.a.m., atorvastatin 80 mg once daily, and entecavir 0.5 mg once daily.   REVIEW OF SYSTEMS:  CONSTITUTIONAL: As in HPI. No fevers or chills. No night sweats.  HEENT: Denies any headaches or dizziness. No epistaxis, ear or jaw pain.  CARDIAC: No angina, palpitation, orthopnea, or PND.  LUNGS: As in HPI.  GASTROINTESTINAL: No nausea, vomiting, or diarrhea. No bright red blood in stools or melena  GENITOURINARY: As in HPI.  SKIN: No new rashes or pruritus.  HEMATOLOGIC: Has hematuria otherwise denies bleeding symptoms.  MUSCULOSKELETAL: As in history of present illness. Having severe low back pain. Denies other new bone pains.  EXTREMITIES: No new swelling  NEUROLOGIC: No new focal weakness, seizures or loss of consciousness.  ENDOCRINE: No polyuria or polydipsia. Appetite is fairly steady.  PERFORMANCE STATUS:  ECoG 1.   PHYSICAL EXAMINATION:  GENERAL: The patient is a moderately built and nourished individual, resting in bed, alert and oriented and converses appropriately. No acute distress. No icterus.  VITAL SIGNS: 98.6, 74, 20, 119/72, 94% on room air.  HEENT: Normocephalic, atraumatic. Extraocular movements intact. No oral thrush.  NECK: Negative for lymphadenopathy.  CARDIOVASCULAR:  S1, S2, regular rate and rhythm.  LUNGS: Show bilateral good air entry, no crepitations or rhonchi noted.  ABDOMEN: Soft, nontender. No hepatosplenomegaly clinically.  EXTREMITIES: No major edema or cyanosis.  SKIN: Shows no generalized rashes or major bruising.  NEUROLOGIC: Limited exam. Cranial nerves intact. Moves all extremities spontaneously.  MUSCULOSKELETAL: No obvious joint redness or swelling.   LABORATORY RESULTS: WBC 2300, ANC 1500, hemoglobin 7.1, platelets 67. From March 4 creatinine 1.25, calcium 8, LFT showed bilirubin 0.4, alkaline  phosphatase 104.   ALT 11, AST 27 and albumin low at 2.6.   IMPRESSION AND RECOMMENDATIONS: A 69 year old gentleman  with past medical history is significant for bladder cancer (diagn[facility] and [facility] with surgery and radiation, otherwise further details regarding stage/pathology/treatment history not available for review today and will need [facility] The patient currently admitted with persistent hematuria along with severe low back pain. CT scan of the abdomen and pelvis is showing findings consistent with widespread metastatic disease to the lungs, liver, bones, and lymphadenopathy. Having been admitted with CT scan and explained to the patient that at this time findings seem to be most consistent with metastatic stage IV bladder cancer and [facility] to see how advanced his original stage bladder cancer was. I also explained that with metastatic disease, stage IV malignancy is incurable and treatments offered are with palliative intent only. I have explained that the treatment is palliative chemotherapy, patient is unsure if he would want to pursue this at this time but will think about it, I have explained, in general about chemotherapy response rates and possible side effects with regimen including gemcitabine and carboplatin or Taxol and carboplatin. Also, he still has issues with low back pain.  Will get a CT scan of the chest and bone scan on Monday to evaluate extent of bone metastasis and lung metastasis.  If he continues to have pain issues and has abnormal bone scan in this area, then could consider palliative radiation for pain control.  In the meantime, will make Dilaudid p.r.n. more frequently at q.2 hours p.r.n.  The patient also has anemia, which is likely from ongoing significant hematuria along with history of hepatitis B.  In addition, if he does have leukopenia, mild neutropenia and  thrombocytopenia, which is also likely from history of hepatitis B and finding of cirrhosis on CT scan. I have also explained that this pre-existing pancytopenia could be  an issue with him tolerating good dosages of chemotherapy but will need to watch this closely if he starts on treatment. Will continue to follow.   Thank you for the referral.  Please feel free to contact me for any additional questions.    ____________________________ Maren ReamerSandeep R. Sherrlyn HockPandit, MD srp:at D: 04/19/2014 20:27:12 ET T: 04/19/2014 21:19:29 ET JOB#: [id number]  cc: Darryll CapersSandeep R. Sherrlyn HockPandit, MD, <Dictator> Wille CelesteSANDEEP R Joslyne Marshburn MD ELECTRONICALLY SIGNED 04/20/2014 9:29

## 2014-06-15 NOTE — H&P (Signed)
PATIENT NAME:  Matthew Fitzgerald, Matthew Fitzgerald MR#:  [id number] DATE OF BIRTH:  [birth year]  DATE OF ADMISSION:  04/19/2014  REFERRING PHYSICIAN: Coolidge Breeze, MD   PRIMARY CARE PRACTITIONER: Nonlocal at [facility]   ADMITTING DOCTOR: Crissie Figures, MD   CHIEF COMPLAINTS:  1.  Passing bloody urine through the urethra and the nephrostomy tubes since June.  2.  Increasing back pain.    HISTORY OF PRESENT ILLNESS: A 69 year old Caucasian male with a history of bladder cancer status post radiation treatment and surgery, under care of [facility] urology and oncology; hypertension; hepatitis B; abdominal aortic aneurysm status post stents, presents to the Emergency Room with the complaints of ongoing bloody urine with clots per urethra and also per bilateral nephrostomy tubes, which has been going on since of June 2015. The patient also noted to have increasing back pain for the past few days, hence came to the Emergency Room for further evaluation. Denies any fevers, chills. No generalized weakness. No dizziness. No palpitations. No loss of consciousness. In the Emergency Room, the patient was evaluated by the ED physician and was noted to have gross hematuria with passing of blood and also clots per urethra and also per nephrostomy tubes, but was found to be with stable vitals; hemoglobin and hematocrit were low but stable around 7.8/25. Urinalysis showed abnormal findings suggestive of urinary tract infection. A CT scan of the abdomen and the pelvis revealed bladder cancer, which has metastasized to lung, liver, and the bones, and was also noted to have pathological fracture of L5 vertebra. In view of significant back pain and ongoing hematuria, hospitalist service was consulted for further evaluation and management. The patient had a repeat hemoglobin and hematocrit and it is stable around 7.8 at this time. No history of any chest pain, palpitations. No dizziness. No loss of consciousness. No cough. No  fever. No nausea, vomiting, or diarrhea.   PAST MEDICAL HISTORY:  1.  Bladder cancer, status post radiation treatment and surgery. The patient is under care of [facility] urologist and oncologist.  2.  Hepatitis B.  3.  AAA status post stenting.   PAST SURGICAL HISTORY:  1.  Bladder surgery for cancer in June 2015.  2.  Bilateral nephrostomy tube placement.  3.  Stenting for abdominal aortic aneurysm.   ALLERGIES: No known drug allergies.   FAMILY HISTORY: Father with heart disease.   SOCIAL HISTORY: He is divorced, lives with his son. Denies any history of smoking, alcohol, or substance abuse.    HOME MEDICATIONS:  1.  Atorvastatin 80 mg tablet 1 tablet orally once a day.  2.  Entecavir 0.5 mg oral tablet 1 tablet orally once a day in the morning.  3.  Finasteride 5 mg 1 tablet orally once a day.  4.  Propranolol 40 mg 1 tablet orally 2 times a day.  5.  Senna 8.6 mg oral tablet 1 tablet orally once a day at bedtime as needed for constipation.  6.  Tamsulosin 0.4 mg oral capsule 1 capsule orally once a day in the morning.   REVIEW OF SYSTEMS:  CONSTITUTIONAL: Negative for fever or chills. No fatigue. No generalized weakness.  EYES: Negative for blurred vision, double vision. No pain. No redness. No discharge.  EARS, NOSE, AND THROAT: Negative for tinnitus, ear pain, hearing loss, epistaxis, nasal discharge.  RESPIRATORY: Negative for cough, wheezing, dyspnea, hemoptysis, painful respiration.  CARDIOVASCULAR: Negative for chest pain, palpitations, dizziness, syncopal episodes, orthopnea, dyspnea on exertion, or pedal  edema.  GASTROINTESTINAL: Negative for nausea, vomiting, diarrhea, constipation, abdominal pain, hematemesis, melena, rectal bleeding.  GENITOURINARY: Positive for passing of blood through the urethra and also bilateral nephrostomy tubes, as noted in the history of present illness.   ENDOCRINE: Negative for polyuria, nocturia, heat or cold intolerance.  HEMATOLOGIC AND  LYMPHATIC: Positive for chronic anemia and hematuria, as noted in the history of present illness.  INTEGUMENTARY: Negative for acne, skin rash, or lesions.  MUSCULOSKELETAL: Positive for increasing back pain, as noted in the history of present illness.  NEUROLOGICAL: Negative for focal weakness or numbness. No history of CVA, TIA, or seizure disorder.  PSYCHIATRIC: Negative for anxiety, insomnia, depression.   PHYSICAL EXAMINATION:  VITAL SIGNS: Temperature 98.2 degrees Fahrenheit, pulse rate 80 per minute, respirations 18 per minute, blood pressure 108/73, pulse oximetry 97% on room air.  GENERAL: Well developed, well nourished, alert, in no acute distress, comfortably resting in the bed.  HEAD: Atraumatic, normocephalic.  EYES: Pupils equal, react to light and accommodation. No conjunctival pallor. No icterus. Extraocular movements intact.  NOSE: No drainage. No lesions.  EARS: No drainage. No external lesions. ORAL CAVITY: No mucosal lesions. No exudates.  NECK: Supple. No JVD. No thyromegaly. No carotid bruit. Range of motion of neck within normal limits.  RESPIRATORY: Good respiratory effort. Not using accessory muscles of respiration. Bilateral vesicular breath sounds present. No rales or rhonchi.  CARDIOVASCULAR: S1, S2 regular. No murmurs, gallops, or clicks. Pulses equal at carotid, femoral, and pedal pulses. No peripheral edema.  GASTROINTESTINAL: Abdomen soft, nontender. No hepatosplenomegaly. No masses. No rigidity. No guarding. Bowel sounds present and equal in all 4 quadrants.  BACK: Bilateral nephrostomy tubes present and draining dark-colored urine with some blood in the nephrostomy bag. Mild tenderness over the lumbar spine present.  GENITOURINARY: Deferred.  MUSCULOSKELETAL: No joint tenderness or effusion. Range of motion is adequate. Strength and tone equal bilaterally.  SKIN: Inspection within normal limits.  LYMPHATIC: No cervical lymphadenopathy.  VASCULAR: Good dorsalis  pedis and posterior tibial pulses.  NEUROLOGICAL: Alert, awake, and oriented x 3. Cranial nerves II-XII grossly intact. No sensory deficit. Motor strength is 5/5 in both the lower and upper extremities. DTRs 2+ bilateral and symmetrical. Plantars downgoing.  PSYCHIATRIC: Alert, awake, and oriented x 3. Judgment and insight are adequate. Memory and mood within normal limits.   ANCILLARY DATA:  LABORATORY DATA: Serum glucose 95, BUN 12, creatinine 1.25, sodium 137, potassium 4.0, chloride 109, bicarbonate 25, total calcium 8.0, total protein 7.1, albumin 2.6, total bilirubin 0.4, alkaline phosphatase 104, AST 27, ALT 11, total CK 34. WBC 2.9 hemoglobin 7.8, hematocrit 25.3, platelet count 80,000. Urinalysis: Cloudy, nitrite positive, leukocyte esterase 3+, RBC more than 1000, WBC 310, bacteria 2+.   IMAGING STUDIES: CT of the abdomen and the chest:  1.  Bladder cancer with pulmonary, hepatic, osseous, and nodal metastases.  2.  Nondisplaced pathological fracture of the L5 body.  3.  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  4.  Bladder wall indistinctness, likely treatment changes, superimposed infection cystitis possibility.   ASSESSMENT AND PLAN: A 69 year old Caucasian male with a history of bladder cancer status post radiation treatment and surgery, hepatitis B, hypertension,  presents with ongoing blood in the urine both through urethra and per nephrostomy tubes, worsening back pain, found to have hematuria, urinary tract infection,  pathological fracture of L5 body and a CT of the abdomen and pelvis with metastatic disease to liver, lung, bone, and nodes.  1.  Hematuria, ongoing. Known bladder cancer status post radiation treatment and surgery, under care of oncology and urologist at [facility], status post bilateral nephrostomy tubes. The patient is hemodynamically stable. Hemoglobin low but stable. PLAN: Admit. Monitor vitals closely and hemoglobin and hematocrit closely.  Type and crossmatch. Urology consultation requested for further advice.  2.  Urinary tract infection. PLAN: Urine culture and sensitivity. Levaquin and follow up urine cultures.  3.  Bladder cancer, metastatic to lung, liver, bone, and nodes, status post radiation treatment and surgery under the care of [facility] oncologist and urologist. PLAN: Oncology consultation for further advice. Also, obtain urology consultation for further advice.  4.  Pathological fracture of L5 with worsening back pain. PLAN: Pain control measures and orthopedic consultation requested for further advice.  5.  Hepatitis B, stable on home medications. Continue same.  6.  Deep vein thrombosis prophylaxis. Sequential compression devices.  7.  Gastrointestinal prophylaxis. Proton pump inhibitors.   CODE STATUS: Full code.   TIME SPENT: 55 minutes.    ____________________________ Crissie Figures, MD enr:bm D: 04/19/2014 02:44:51 ET T: 04/19/2014 03:22:11 ET JOB#: [id number]  cc: Crissie Figures, MD, <Dictator> [facility] Crissie Figures MD ELECTRONICALLY SIGNED 04/20/2014 6:58

## 2014-06-15 NOTE — Discharge Summary (Signed)
PATIENT NAME:  Matthew MillinerRIGGAN, Kodey E MR#:  [id number] DATE OF BIRTH:  01-Dec-1945  DATE OF ADMISSION:  04/19/2014 DATE OF DISCHARGE:  04/24/2014  ADMITTING DIAGNOSES: 1.  Hematuria.  2.  Back pain.   DISCHARGE DIAGNOSES:  1.  Hematuria due to metastatic bladder cancer with metastasis to lung, liver, bone, nodes. 2.  Acute blood loss anemia secondary to hematuria status post transfusion, continues to bleed.  3.  Urinary tract infection secondary to methicillin-resistant Staphylococcus aureus treated with Bactrim.  4.  Acute back pain with pathological fracture of L5.  5.  Hypertension.  6.  Diarrhea with positive Clostridium difficile.  CONSULTANTS DURING HOSPITALIZATION: Sandeep R. Sherrlyn HockPandit, MD; Kathreen DevoidKevin L. Krasinski, MD; Tawni MillersEdward R. Weldon InchesHouser, II, MD; Ned GraceNancy Phifer, MD.   HOSPITAL COURSE: Please refer to H and P done by the admitting physician. The patient is a 69 year old white male with diffuse metastatic bladder cancer who has received therapy with radiation and [facility] and [facility]rolog[facility], who presented with these symptoms. The patient carries the diagnosis of metastatic bladder cancer. He was admitted and further evaluation was done. It was recommended by urology to continue supportive care, transfuse as needed. He was also seen by oncology. They feel that there are no further recommendations in terms of his disease since it was so advanced. They recommended comfort measures. The patient was seen, also, by orthopedics for back pain, had a bone scan done. His pain is under control now. The patient continues to have hematuria, hematochezia. Further discussions with palliative care were done with the patient, and it was discussed to transfer him to the hospice home.  MEDICATIONS AT DISCHARGE: Flomax 0.4 daily; finasteride 5 daily; dexamethasone 4 mg 1 tab p.o. b.i.d.; fentanyl 12 mcg topically every 3 days; acetaminophen and hydrocodone 325/5, 1 tab p.o. q.4 p.r.n. for moderate pain; Protonix 40  daily; Zofran 4 mg q.6 p.r.n.  DISPOSITION: Discharged to hospice home.   TIME SPENT: 35 minutes.    ____________________________ Lacie ScottsShreyang H. Allena KatzPatel, MD shp:ST D: 04/24/2014 12:30:12 ET T: 04/24/2014 22:28:35 ET JOB#: [id number]  cc: Bauer Ausborn H. Allena KatzPatel, MD, <Dictator> Charise CarwinSHREYANG H Alamin Mccuiston MD ELECTRONICALLY SIGNED 04/30/2014 14:18

## 2014-06-15 NOTE — Consult Note (Signed)
PATIENT NAME:  Matthew Fitzgerald, Matthew Fitzgerald MR#:  [id number] DATE OF BIRTH:  Aug 01, 1945  DATE OF CONSULTATION:  04/19/2014  REFERRING PHYSICIAN:   CONSULTING PHYSICIAN:  Kathreen DevoidKevin L. Belisa Eichholz, MD  REASON FOR CONSULTATION: Pathologic fracture of L5.   HISTORY OF PRESENT ILLNESS: Matthew Fitzgerald is a 69 year old male with a history of bladder cancer who is status post radiation treatment and surgery. [facility]. He is also status post stenting for an abdominal aortic aneurysm. He presented to the Emergency Room with hematuria. He also states that he has developed low back pain earlier this week. He had been taking half of a pain pill at home which initially had helped, but his pain has become progressively worse. He denies any injury or fall. The patient states the pain is located in the low back near his belt line. He denies any new onset of numbness or tingling but does have chronic tingling in his left lower extremity. He denies any new weakness in the lower extremities. A CAT scan of the abdomen and pelvis has revealed the bladder cancer has metastasized to the lung, liver, and spine. He has sustained a fracture of the L5 vertebrae through a metastatic lesion in this bone.   PAST MEDICAL HISTORY: Includes bladder cancer, hepatitis B, AAA status post stenting, history of bladder surgery for cancer in June 2015, and bilateral nephrostomy tube placement.   ALLERGIES: No known drug allergies.   SOCIAL HISTORY: The patient is divorced and lives with a son. He denies any history of smoking, alcohol or substance abuse.   HOME MEDICATIONS: Include atorvastatin 80 mg once daily, entecavir 0.5 mg tablet, 1 tablet q.a.m., finasteride 5 mg 1 tablet daily, propranolol 40 mg 1 tablet b.i.d., and Senna 8.6 mg 1 tablet daily at bedtime as needed for constipation, tamsulosin 0.4 mg 1 tablet q.a.m.   PHYSICAL EXAMINATION:  BACK: The patient had tenderness over the L5 region extending to the right side  into the paraspinal musculature. He had no abnormal curvature to observation. His  skin was intact and there was no erythema, ecchymosis or focal swelling.  LOWER EXTREMITIES: The patient has a urine bag attached to his right lower leg. He has intact sensation to light touch throughout the bilateral lower extremities. He had palpable pedal pulses. He had 5/5 strength with plantarflexion, dorsiflexion, and ankle inversion and eversion. He had intact motor function of his quadriceps, hamstring, and hip flexors.   RADIOLOGY: A CT of the abdomen revealed a fracture involving L5 through a lucent lesion in the L5 vertebral body. The lesion and fracture involved the right side of the vertebral body and extended into the pedicle. There was comminution at the fracture site. The patient has approximately 25% anterolisthesis of L5 on S1. There is significant disk space narrowing between L5 and S1 as well. The patient has a graft in his aorta anterior to the lumbar spine. He has an anterior umbilical hernia.   ASSESSMENT: Pathologic fracture of L5 with spondylolisthesis and degenerative disk disease between L5 and S1.   PLAN: I am recommending for Matthew Fitzgerald that he get a lumbosacral corset. The fracture he has sustained through a pathologic lesion is comminuted. Since I am not a spine surgeon, it is difficult for me to comment on the stability of this fracture. I would not have the patient stand or ambulate at this time. He should wear the lumbosacral corset if he is sitting up in bed, but may remove it  if he is lying supine. I recommend further evaluation by Dr. Kennedy Bucker on Monday to see if he may be a candidate for kyphoplasty. If Dr. Rosita Kea is unable to help the patient, then he may need to be transferred to a facility with a spine specialist for further evaluation and management. Currently, the patient is neurovascularly intact and he may need to be acutely transferred if his neurologic or motor and sensory  function in the lower extremities changes.    ____________________________ Kathreen Devoid, MD klk:at D: 04/19/2014 13:02:51 ET T: 04/19/2014 14:02:32 ET JOB#: [id number]  cc: Kathreen Devoid, MD, <Dictator> Kathreen Devoid MD ELECTRONICALLY SIGNED 04/28/2014 12:25

## 2016-06-16 IMAGING — CT CT ABD-PELV W/ CM
2 of 5 series · 15 of 46 positions shown, 17 images · IV contrast (omnipaque)
Comparison: 01/17/2006

CLINICAL DATA: Bladder cancer with bilateral nephrostomy tubes.
Lower back pain for 1 week and blood the nephrostomy tube output.

EXAM:
CT ABDOMEN AND PELVIS WITH CONTRAST
TECHNIQUE: Multidetector CT imaging of the abdomen and pelvis was performed
using the standard protocol following bolus administration of
intravenous contrast.
CONTRAST:  100 cc Omnipaque 350 intravenous

[Series 2: routine abd pel with · axial · 0.84mm/px · z∈[-456,-11]mm · 12 of 101 slices shown, 14 images]
[im 6/101  soft-tissue]
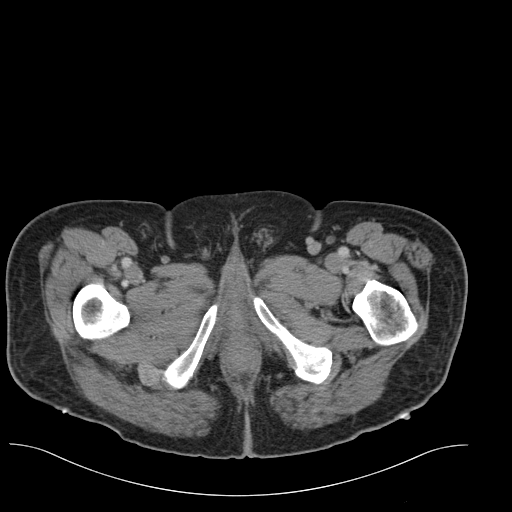
[im 6/101  bone]
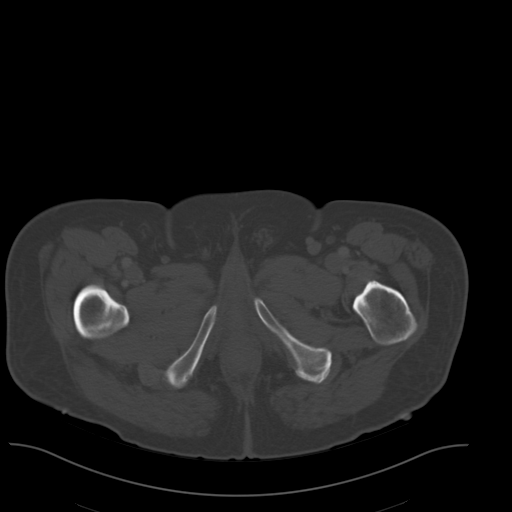
[im 18/101  soft-tissue]
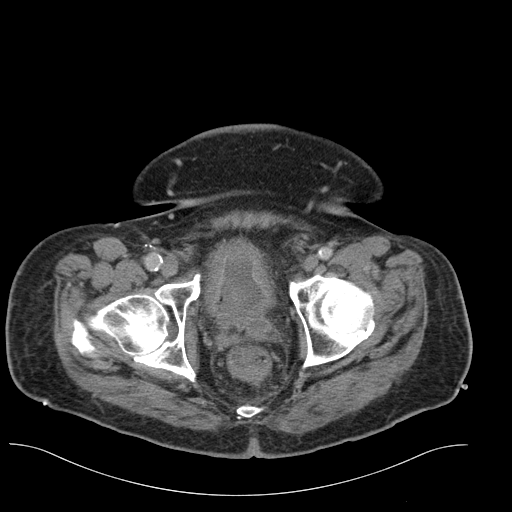
[im 24/101  soft-tissue]
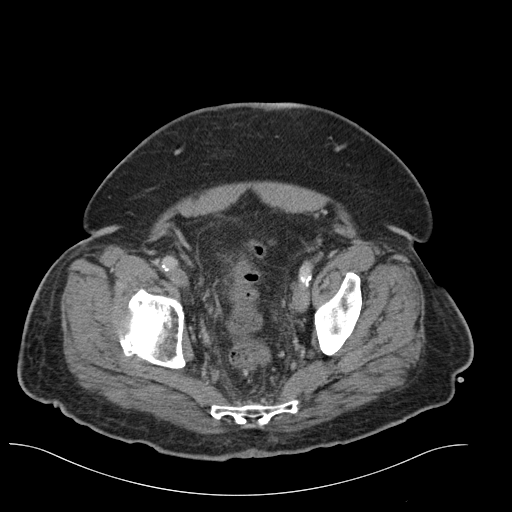
[im 30/101  soft-tissue]
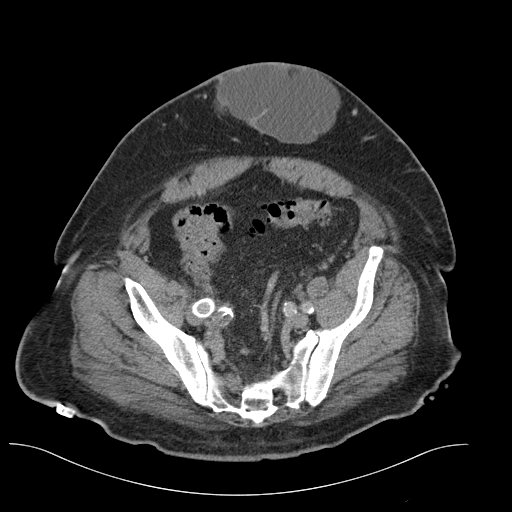
[im 42/101  soft-tissue]
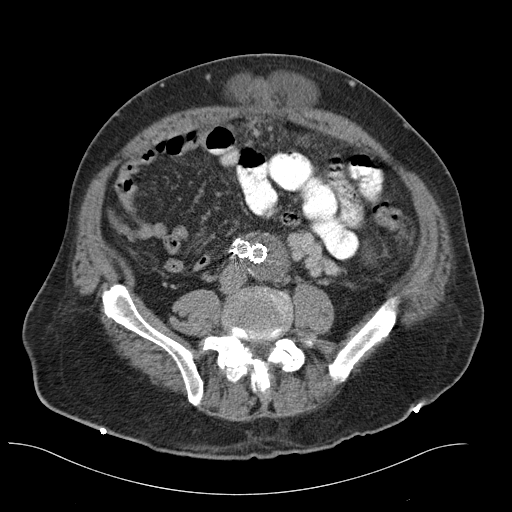
[im 48/101  soft-tissue]
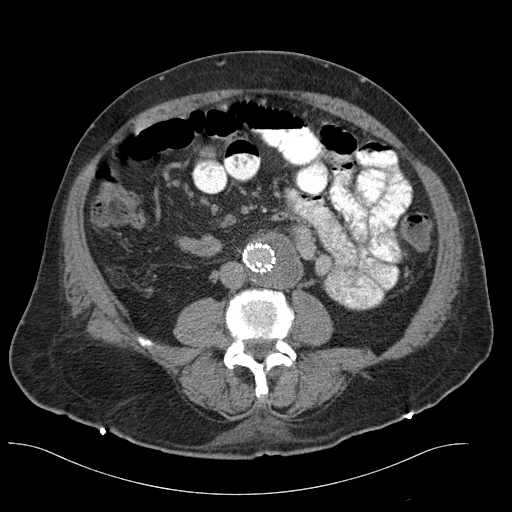
[im 53/101  soft-tissue]
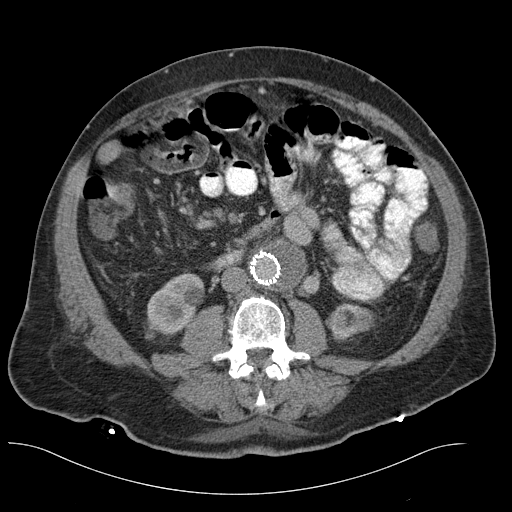
[im 65/101  soft-tissue]
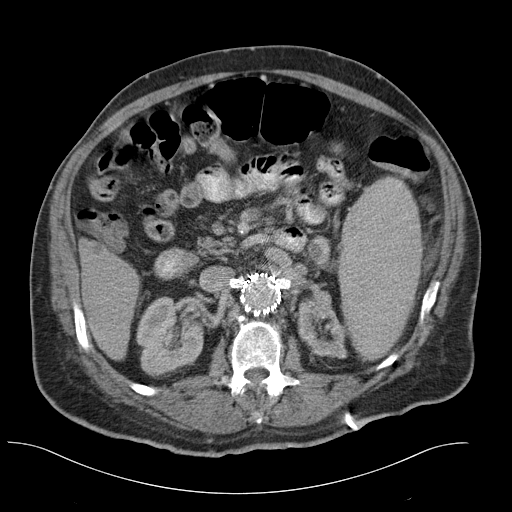
[im 71/101  soft-tissue]
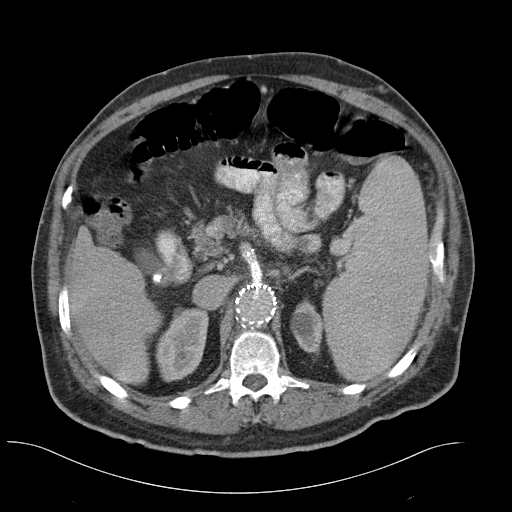
[im 71/101  bone]
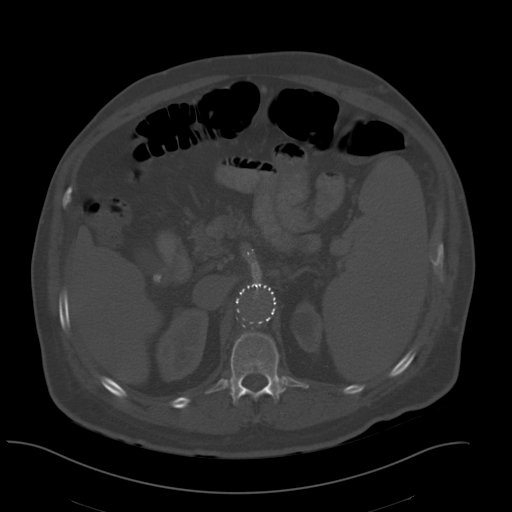
[im 77/101  soft-tissue]
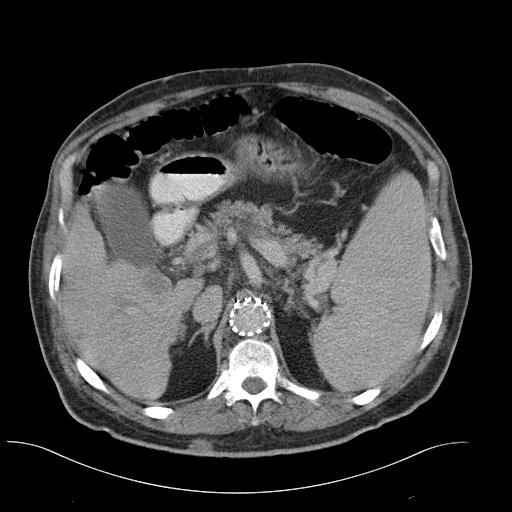
[im 89/101  soft-tissue]
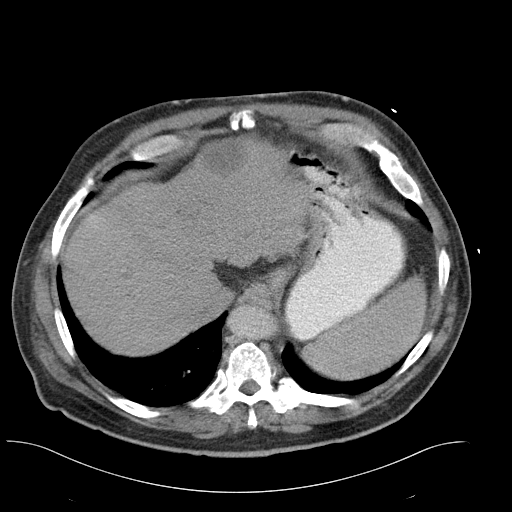
[im 95/101  soft-tissue]
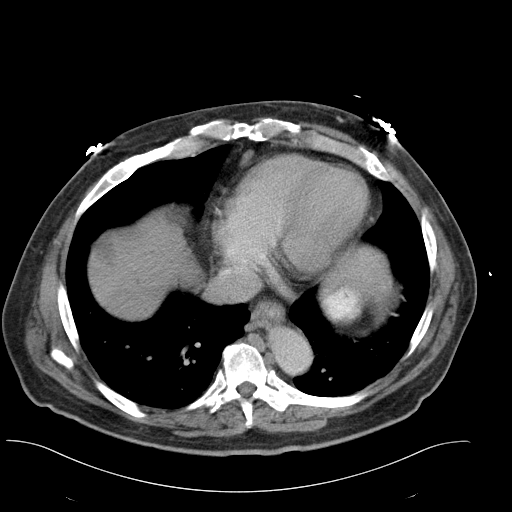

[Series 5: cor routine abd pel with · coronal · 0.84mm/px · 3 of 176 slices shown]
[im 59/176  soft-tissue]
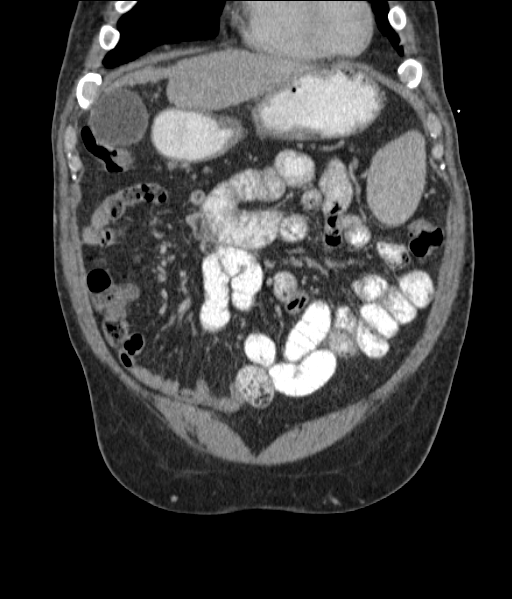
[im 78/176  soft-tissue]
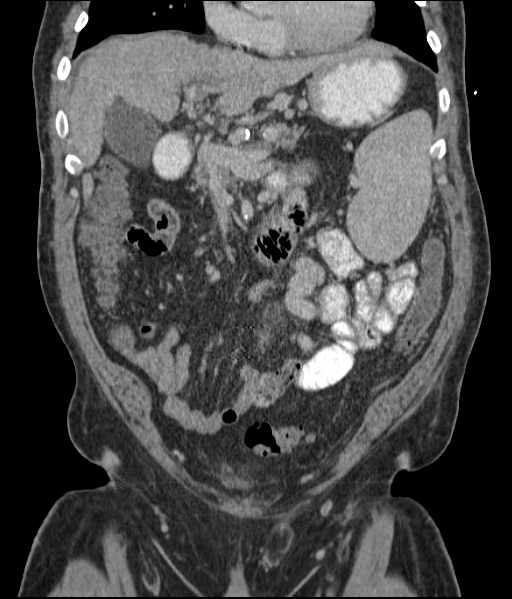
[im 98/176  soft-tissue]
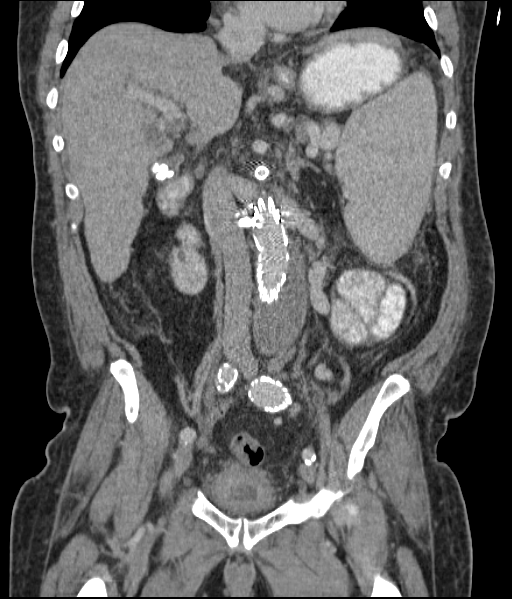

[15 of 46 positions shown; findings below may reference images not displayed]

FINDINGS: BODY WALL: Low-density subcutaneous nodule in the right upper back
is likely dermal inclusion cyst.

There is a large umbilical hernia containing infiltrated omentum and
trapped ascitic fluid.

LOWER CHEST: Innumerable small pulmonary nodules in the lower lungs,
all measuring a cm or smaller. Periesophageal varices. There is
juxta diaphragmatic node prominence, likely metastatic disease.

ABDOMEN/PELVIS:

Liver: Cirrhotic liver morphology with multiple hypo enhancing
masses. The largest masses in segment 2, measuring 4.6 cm in maximal
diameter. There is thrombus within the bilateral main and segmental
portal veins, main portal vein, jejunal branches, IMV, SMV, and
splenic vein. There is no definitive enhancement of thrombus on
delayed imaging.

Biliary: Distended gallbladder with multiple stones. No inflammatory
wall changes

Pancreas: Unremarkable.

Spleen: Splenomegaly with 20 cm AP dimension. There is a remote
appearing infarct in the upper parenchyma.

Adrenals: Unremarkable.

Kidneys and ureters: Left more than right renal atrophy. There are
multiple bilateral cysts. Percutaneous nephrostomy tubes are in good
position. No hydronephrosis.

Bladder: Thick walled and irregular bladder, likely sequela of
bladder cancer treatment. There is a diverticulum from the posterior
wall. Gas is present within the bladder, possibly introduced from
the percutaneous nephrostomy tubes. Gas-forming organism not
excluded. There is pelvic lymphadenopathy with a 16 mm short axis
right external iliac chain lymph node.

Reproductive: Unremarkable.

Bowel: No obstruction. Normal appendix.

Peritoneum: Small ascites, mainly trapped in the umbilical hernia

Vascular: Infrarenal aortic aneurysm status post aortoiliac stent
graft. There is no visible contrast within the excluded lumen. The
renals and SMA have also been stented, and have normal appearing
enhancement

OSSEOUS: Permeative lucency in the L5 right body, extending into the
pedicle and right transverse process, complicated by a sagittally
oriented fracture that is nondisplaced. There is presumably chronic
grade 1 slip from bilateral pars defects at this level. No gross
canal invasion of tumor, although there could be compromise of the
left foramen by tumor (there is foraminal narrowing from
degenerative changes already).
IMPRESSION: 1. Bladder cancer with pulmonary, hepatic, osseous, and nodal
metastases. No comparison imaging available.
2. Nondisplaced pathologic fracture of the L5 body.
3. Cirrhosis with portal hypertension and diffuse portal venous
thrombosis.
4. Bladder wall indistinctness is likely treatment change. Correlate
with urinalysis to exclude a superimposed infectious cystitis as a
cause of pain.
5. Large umbilical hernia containing infiltrated omentum and trapped
ascitic fluid. If incarcerated this could be a source of abdominal
pain.
